# Patient Record
Sex: Male | Born: 1985 | Race: White | Hispanic: No | Marital: Married | Smoking: Never smoker
Health system: Southern US, Community
[De-identification: ages and names within clinical notes are randomized; demographics above are authoritative.]

## PROBLEM LIST (undated history)

## (undated) DIAGNOSIS — R42 Dizziness and giddiness: Secondary | ICD-10-CM

## (undated) DIAGNOSIS — I10 Essential (primary) hypertension: Secondary | ICD-10-CM

---

## 2018-05-09 DIAGNOSIS — R21 Rash and other nonspecific skin eruption: Secondary | ICD-10-CM | POA: Insufficient documentation

## 2018-06-12 DIAGNOSIS — I10 Essential (primary) hypertension: Secondary | ICD-10-CM | POA: Insufficient documentation

## 2018-06-12 DIAGNOSIS — N529 Male erectile dysfunction, unspecified: Secondary | ICD-10-CM | POA: Insufficient documentation

## 2018-06-12 DIAGNOSIS — S46911A Strain of unspecified muscle, fascia and tendon at shoulder and upper arm level, right arm, initial encounter: Secondary | ICD-10-CM | POA: Insufficient documentation

## 2018-06-12 DIAGNOSIS — R0683 Snoring: Secondary | ICD-10-CM | POA: Insufficient documentation

## 2019-12-26 DIAGNOSIS — R42 Dizziness and giddiness: Secondary | ICD-10-CM | POA: Insufficient documentation

## 2019-12-26 DIAGNOSIS — H538 Other visual disturbances: Secondary | ICD-10-CM | POA: Insufficient documentation

## 2020-01-12 ENCOUNTER — Inpatient Hospital Stay: Payer: Managed Care, Other (non HMO) | Attending: Internal Medicine | Admitting: Internal Medicine

## 2020-01-12 DIAGNOSIS — R7989 Other specified abnormal findings of blood chemistry: Secondary | ICD-10-CM | POA: Insufficient documentation

## 2020-04-08 ENCOUNTER — Emergency Department: Payer: 59

## 2020-04-08 ENCOUNTER — Emergency Department
Admission: EM | Admit: 2020-04-08 | Discharge: 2020-04-08 | Disposition: A | Discharge status: Home / Self Care | Payer: 59 | Attending: Student | Admitting: Student

## 2020-04-08 ENCOUNTER — Other Ambulatory Visit: Payer: Self-pay

## 2020-04-08 DIAGNOSIS — N200 Calculus of kidney: Secondary | ICD-10-CM | POA: Diagnosis not present

## 2020-04-08 DIAGNOSIS — Z79899 Other long term (current) drug therapy: Secondary | ICD-10-CM | POA: Insufficient documentation

## 2020-04-08 DIAGNOSIS — I10 Essential (primary) hypertension: Secondary | ICD-10-CM | POA: Diagnosis not present

## 2020-04-08 DIAGNOSIS — R161 Splenomegaly, not elsewhere classified: Secondary | ICD-10-CM

## 2020-04-08 DIAGNOSIS — R109 Unspecified abdominal pain: Secondary | ICD-10-CM | POA: Diagnosis present

## 2020-04-08 HISTORY — DX: Essential (primary) hypertension: I10

## 2020-04-08 HISTORY — DX: Dizziness and giddiness: R42

## 2020-04-08 LAB — URINALYSIS, COMPLETE (UACMP) WITH MICROSCOPIC
Glucose, UA: NEGATIVE mg/dL
Leukocytes,Ua: NEGATIVE
Nitrite: NEGATIVE
Protein, ur: 100 mg/dL — AB
RBC / HPF: >50 RBC/hpf (ref 0–5)
Specific Gravity, Urine: >1.03 — ABNORMAL HIGH (ref 1.005–1.030)
pH: 5.5 (ref 5.0–8.0)

## 2020-04-08 LAB — CBC WITH DIFFERENTIAL/PLATELET
Abs Immature Granulocytes: 0.01 10*3/uL (ref 0.00–0.07)
Basophils Absolute: 0 10*3/uL (ref 0.0–0.1)
Basophils Relative: 1 %
Eosinophils Absolute: 0.1 10*3/uL (ref 0.0–0.5)
Eosinophils Relative: 1 %
HCT: 46.4 % (ref 39.0–52.0)
Hemoglobin: 17.2 g/dL — ABNORMAL HIGH (ref 13.0–17.0)
Immature Granulocytes: 0 %
Lymphocytes Relative: 25 %
Lymphs Abs: 2.1 10*3/uL (ref 0.7–4.0)
MCH: 32.5 pg (ref 26.0–34.0)
MCHC: 37.1 g/dL — ABNORMAL HIGH (ref 30.0–36.0)
MCV: 87.7 fL (ref 80.0–100.0)
Monocytes Absolute: 0.4 10*3/uL (ref 0.1–1.0)
Monocytes Relative: 5 %
Neutro Abs: 5.6 10*3/uL (ref 1.7–7.7)
Neutrophils Relative %: 68 %
Platelets: 235 10*3/uL (ref 150–400)
RBC: 5.29 MIL/uL (ref 4.22–5.81)
RDW: 12.3 % (ref 11.5–15.5)
WBC: 8.2 10*3/uL (ref 4.0–10.5)
nRBC: 0 % (ref 0.0–0.2)

## 2020-04-08 MED ORDER — OXYCODONE-ACETAMINOPHEN 5-325 MG PO TABS: 1.0000 | ORAL_TABLET | ORAL | 0 refills | Status: AC | PRN

## 2020-04-08 MED ORDER — TAMSULOSIN HCL 0.4 MG PO CAPS: 0.4000 mg | ORAL_CAPSULE | Freq: Every day | ORAL | 0 refills | Status: AC

## 2020-04-08 MED ORDER — SODIUM CHLORIDE 0.9 % IV BOLUS
1000.0000 mL | Freq: Once | INTRAVENOUS | Status: AC
  Administered 2020-04-08: 15:00:00 1000 mL via INTRAVENOUS

## 2020-04-08 NOTE — ED Notes (Signed)
Pt given cup to attempt urine sample at this time

## 2020-04-08 NOTE — Discharge Instructions (Addendum)
Follow-up with your regular doctor for your already scheduled appointment.  Drink plenty of fluids.  Take pain medications

## 2020-04-08 NOTE — ED Provider Notes (Signed)
[facility] Emergency Department Provider Note  ____________________________________________   None    (approximate)  I have reviewed the triage vital signs and the nursing notes.   HISTORY  Chief Complaint Urinary Retention    HPI Dakota Gonzales is a 34 y.o. male presents emergency department complaining of some abdominal discomfort and difficulty urinating.  Symptoms started on Sunday.  States that he had started on bupropion and felt that urinary retention was a side effect of this medication so he stopped taking the medication.  States he got somewhat better and then the symptoms have returned.  Did have back pain up around the kidney earlier today with pain radiating into the testicle.  Also having some constipation for the last 2 3 days.  Patient also admits to not drinking a lot of water.  He denies any fever or chills.  No chest pain or shortness of breath.  No penile discharge or concerns for STD.    Past Medical History:  Diagnosis Date  . Dizziness   . Hypertension     Patient Active Problem List   Diagnosis Date Noted  . Elevated ferritin 01/12/2020      Prior to Admission medications   Medication Sig Start Date End Date Taking? Authorizing Provider  busPIRone (BUSPAR) 5 MG tablet Take 5 mg by mouth 3 (three) times daily.   Yes [provider]  hydrochlorothiazide (HYDRODIURIL) 25 MG tablet Take 25 mg by mouth daily.   Yes [provider]  oxyCODONE-acetaminophen (PERCOCET) 5-325 MG tablet Take 1 tablet by mouth every 4 (four) hours as needed for severe pain. 04/08/20 04/08/21  Fox Salminen, Roselyn Bering, PA-C  tamsulosin (FLOMAX) 0.4 MG CAPS capsule Take 1 capsule (0.4 mg total) by mouth daily. 04/08/20   Sherrie Mustache Roselyn Bering, PA-C    Allergies Ceclor [cefaclor] and Septra [sulfamethoxazole-trimethoprim]  No family history on file.  Social History Social History   Tobacco Use  . Smoking status: Not on file  Substance Use Topics    . Alcohol use: Not on file  . Drug use: Not on file    Review of Systems  Constitutional: No fever/chills Eyes: No visual changes. ENT: No sore throat. Respiratory: Denies cough Cardiovascular: Denies chest pain Gastrointestinal: Positive abdominal pain Genitourinary: Negative for dysuria.  Positive decreased urination Musculoskeletal: Negative for back pain. Skin: Negative for rash. Psychiatric: no mood changes,     ____________________________________________   PHYSICAL EXAM:  VITAL SIGNS: ED Triage Vitals  Enc Vitals Group     BP 04/08/20 1101 (!) 146/96     Pulse Rate 04/08/20 1101 71     Resp 04/08/20 1101 19     Temp 04/08/20 1101 98.3 F (36.8 C)     Temp Source 04/08/20 1101 Oral     SpO2 04/08/20 1101 96 %     Weight 04/08/20 1105 (!) 365 lb (165.6 kg)     Height 04/08/20 1105 6\' 1"  (1.854 m)     Head Circumference --      Peak Flow --      Pain Score 04/08/20 1105 8     Pain Loc --      Pain Edu? --      Excl. in GC? --     Constitutional: Alert and oriented. Well appearing and in no acute distress. Eyes: Conjunctivae are normal.  Head: Atraumatic. Nose: No congestion/rhinnorhea. Mouth/Throat: Mucous membranes are moist.   Neck:  supple no lymphadenopathy noted Cardiovascular: Normal rate, regular rhythm. Heart sounds  are normal Respiratory: Normal respiratory effort.  No retractions, lungs c t a  Abd: soft nontender bs normal all 4 quad GU: Penis without lesions, no irritation noted at the urethra, testicles minimally tender if at all. Musculoskeletal: FROM all extremities, warm and well perfused Neurologic:  Normal speech and language.  Skin:  Skin is warm, dry and intact. No rash noted. Psychiatric: Mood and affect are normal. Speech and behavior are normal.  ____________________________________________   LABS (all labs ordered are listed, but only abnormal results are displayed)  Labs Reviewed  URINALYSIS, COMPLETE (UACMP) WITH  MICROSCOPIC - Abnormal; Notable for the following components:      Result Value   APPearance CLOUDY (*)    Specific Gravity, Urine >1.030 (*)    Hgb urine dipstick LARGE (*)    Bilirubin Urine SMALL (*)    Ketones, ur TRACE (*)    Protein, ur 100 (*)    Bacteria, UA RARE (*)    All other components within normal limits  CBC WITH DIFFERENTIAL/PLATELET - Abnormal; Notable for the following components:   Hemoglobin 17.2 (*)    MCHC 37.1 (*)    All other components within normal limits   ____________________________________________   ____________________________________________  RADIOLOGY  Bladder scan shows no urine KUB does not show a large amount of stool burden CT renal stone study shows a 5 mm kidney stone and splenomegaly  ____________________________________________   PROCEDURES  Procedure(s) performed: No  Procedures    ____________________________________________   INITIAL IMPRESSION / ASSESSMENT AND PLAN / ED COURSE  Pertinent labs & imaging results that were available during my care of the patient were reviewed by me and considered in my medical decision making (see chart for details).   Patient is a 34 year old male presents emergency department with lower abdominal pain, flank pain, and difficulty with urination.  Physical exam shows patient to appear well.  Vitals basically normal except blood pressure mildly elevated at 146/96.  Abdomen mildly tender along the suprapubic area, testicles minimally tender remainder the exam is unremarkable  DDx: Urinary retention, kidney stone, epididymitis, dehydration, UTI  UA KUB is normal CT renal stone study  UA has large amount of hemoglobin, greater than 50 RBCs, CBC is normal  KUB does not show a heavy stool burden, CT renal stone does show a 5 mm kidney stone.  Also shows splenomegaly . Encourage the patient to follow-up with his doctor for his already scheduled appointment tomorrow.  Given copy of CT  results so that he can discuss the splenomegaly with her.  He was given a liter of normal saline to help push the kidney stone through, Flomax did not aid him and urinating, pain medication if needed due to the kidney stone.  He states he understands will comply with our instructions.  He was discharged stable condition.   Rohith Fauth was evaluated in Emergency Department on 04/08/2020 for the symptoms described in the history of present illness. He was evaluated in the context of the global COVID-19 pandemic, which necessitated consideration that the patient might be at risk for infection with the SARS-CoV-2 virus that causes COVID-19. Institutional protocols and algorithms that pertain to the evaluation of patients at risk for COVID-19 are in a state of rapid change based on information released by regulatory bodies including the CDC and federal and state organizations. These policies and algorithms were followed during the patient's care in the ED.   As part of my medical decision making, I reviewed the following data  within the [place] notes reviewed and incorporated, Labs reviewed , Old chart reviewed, Radiograph reviewed , Notes from prior ED visits and [place] Controlled Substance Database  ____________________________________________   FINAL CLINICAL IMPRESSION(S) / ED DIAGNOSES  Final diagnoses:  Kidney stone  Splenomegaly      NEW MEDICATIONS STARTED DURING THIS VISIT:  Discharge Medication List as of 04/08/2020  3:24 PM    START taking these medications   Details  oxyCODONE-acetaminophen (PERCOCET) 5-325 MG tablet Take 1 tablet by mouth every 4 (four) hours as needed for severe pain., Starting Thu 04/08/2020, Until Fri 04/08/2021, Normal    tamsulosin (FLOMAX) 0.4 MG CAPS capsule Take 1 capsule (0.4 mg total) by mouth daily., Starting Thu 04/08/2020, Normal         Note:  This document was prepared using Dragon voice recognition software and may include  unintentional dictation errors.    Versie Starks, PA-C 04/08/20 1744    Lilia Pro., MD 04/09/20 [phone]

## 2020-04-08 NOTE — ED Notes (Signed)
Bladder scanned 0 mL. Darl Pikes PA notified

## 2020-04-08 NOTE — ED Triage Notes (Signed)
Pt sent from drs. office for urinary retention since Sunday. Pain with urination in back and groin. Pt also c/o constipation for past 2-3 days.

## 2020-04-08 NOTE — ED Notes (Signed)
See triage note  States he has had some abd discomfort and diff urinating  Sx's started on Sunday  States has only voided small amts at a time  No hx of same  Ambulates well  No fever

## 2020-12-09 IMAGING — CT CT RENAL STONE PROTOCOL
2 of 4 series · 16 of 46 positions shown, 18 images · non-contrast
Comparison: None.

CLINICAL DATA: Flank pain. Kidney stone suspected.

EXAM:
CT ABDOMEN AND PELVIS WITHOUT CONTRAST
TECHNIQUE: Multidetector CT imaging of the abdomen and pelvis was performed
following the standard protocol without IV contrast.

[Series 2: stone full standard · axial · 0.92mm/px · z∈[-901,-326]mm · 13 of 127 slices shown, 15 images]
[im 6/127  soft-tissue]
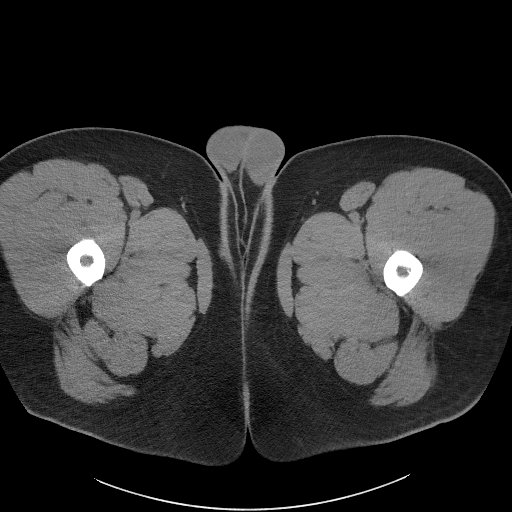
[im 6/127  bone]
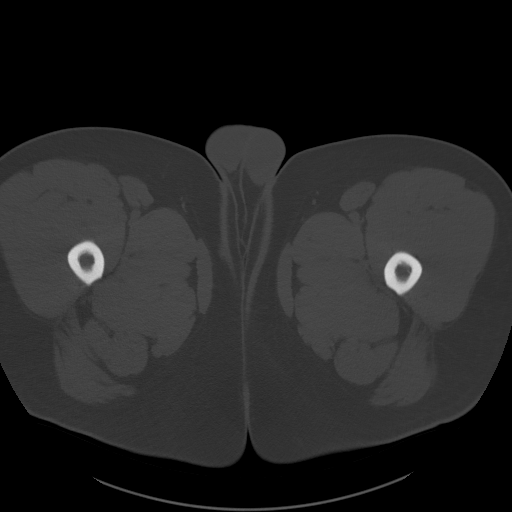
[im 17/127  soft-tissue]
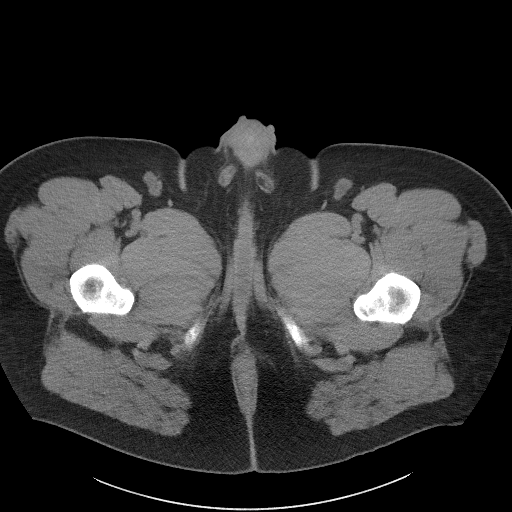
[im 28/127  soft-tissue]
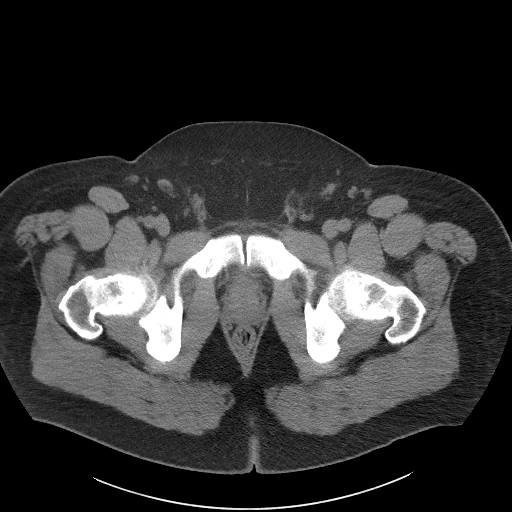
[im 33/127  soft-tissue]
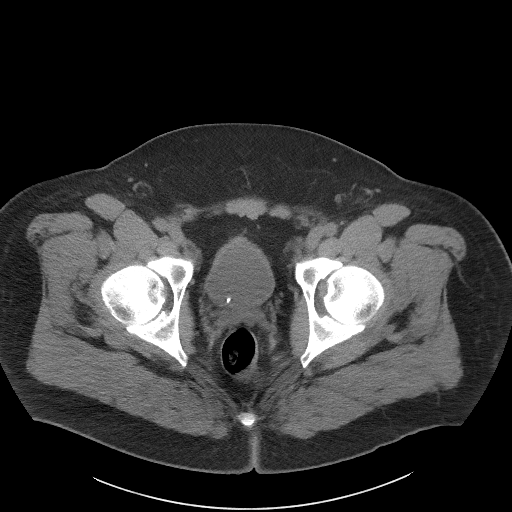
[im 44/127  soft-tissue]
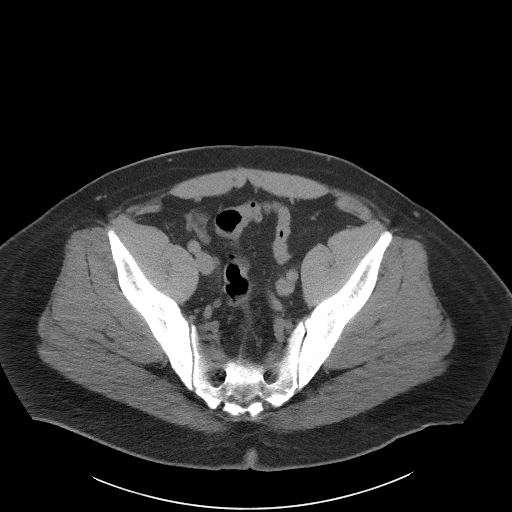
[im 55/127  soft-tissue]
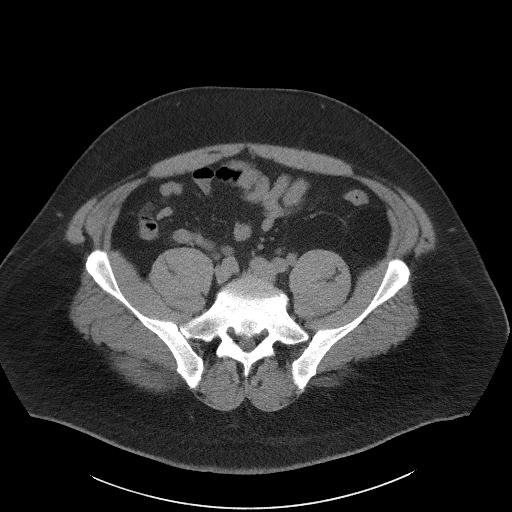
[im 66/127  soft-tissue]
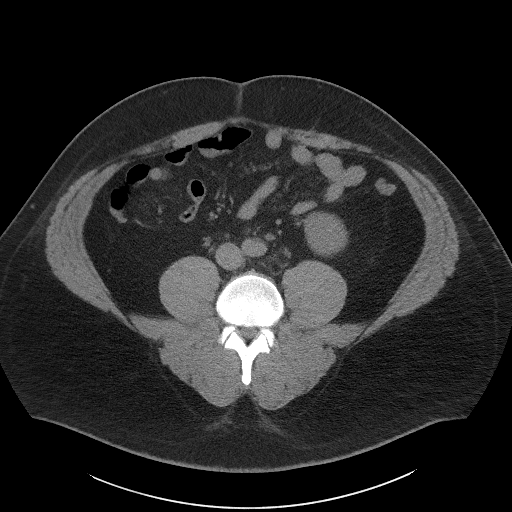
[im 72/127  soft-tissue]
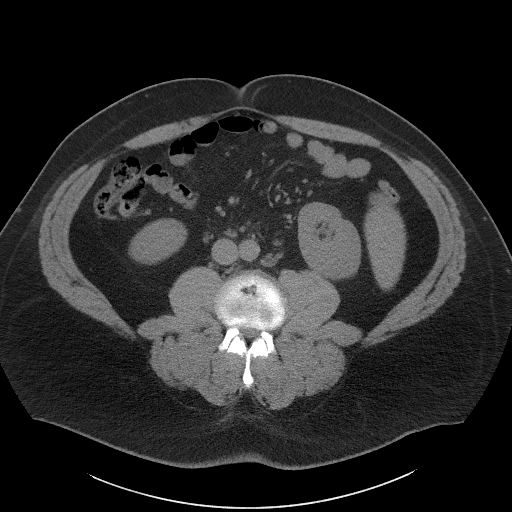
[im 83/127  soft-tissue]
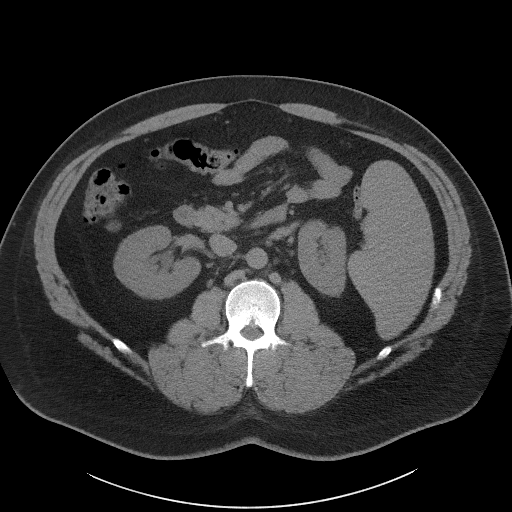
[im 83/127  bone]
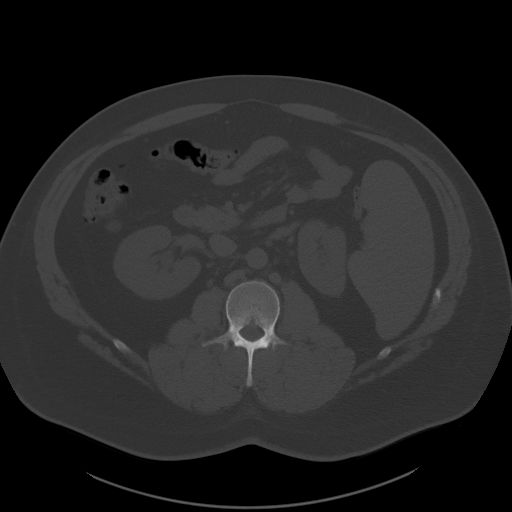
[im 94/127  soft-tissue]
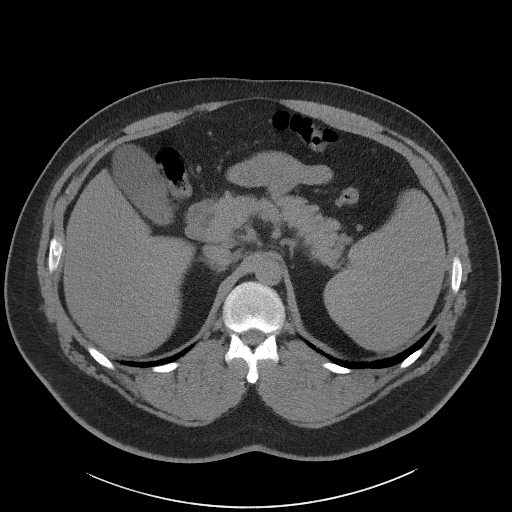
[im 99/127  soft-tissue]
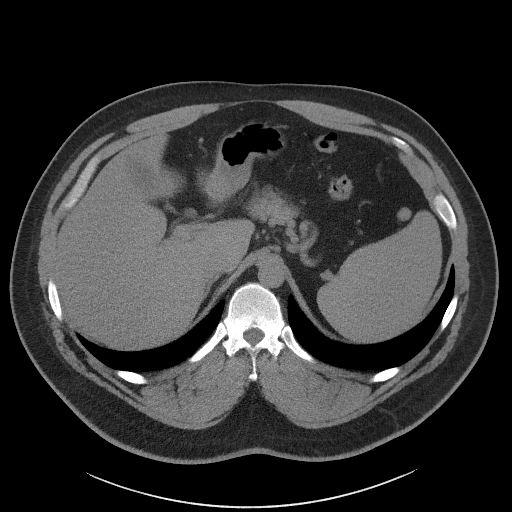
[im 110/127  soft-tissue]
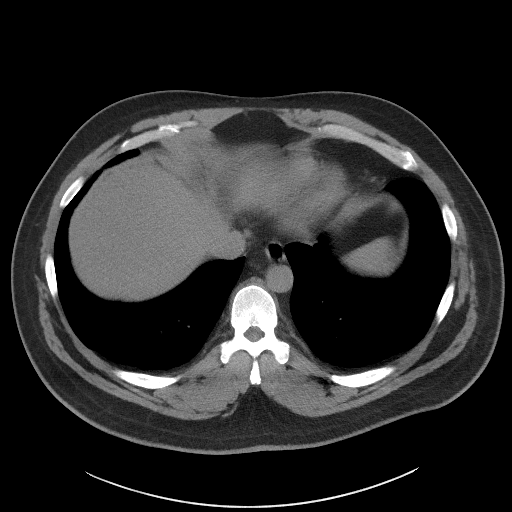
[im 121/127  soft-tissue]
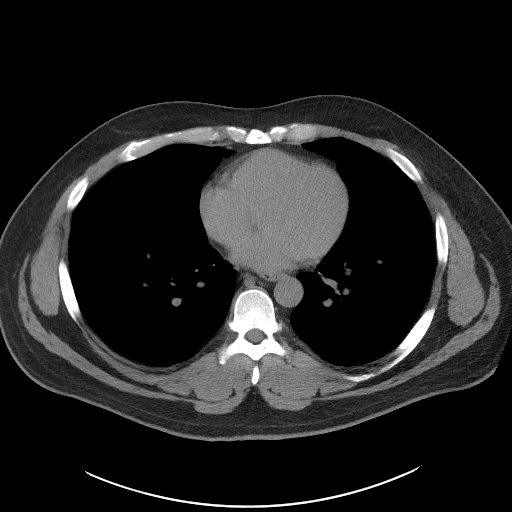

[Series 5: coronal · coronal · 1.03mm/px · 3 of 186 slices shown]
[im 62/186  soft-tissue]
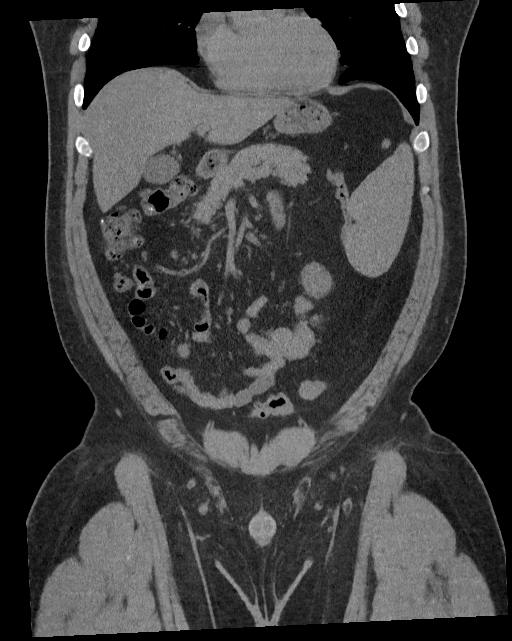
[im 83/186  soft-tissue]
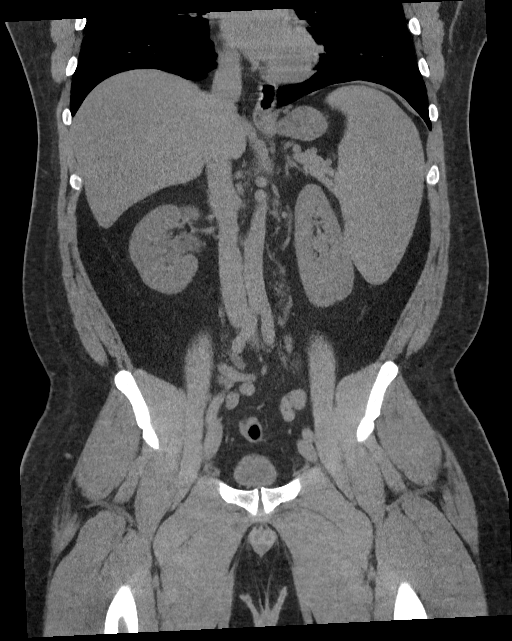
[im 103/186  soft-tissue]
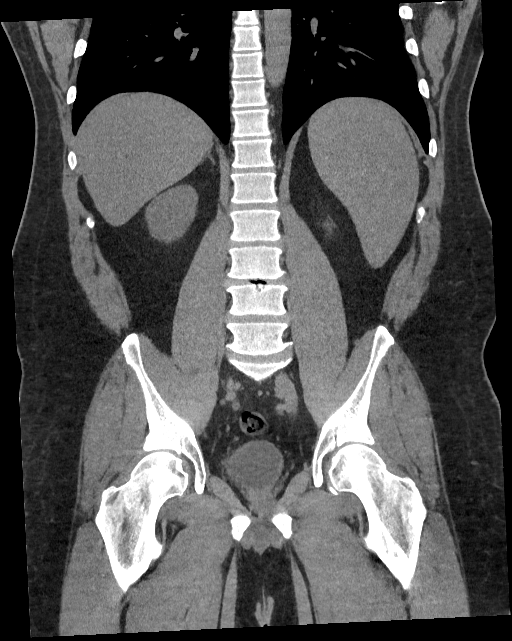

[16 of 46 positions shown; findings below may reference images not displayed]

FINDINGS: Lower chest: No acute abnormality.

Hepatobiliary: No focal liver abnormality is seen. No gallstones,
gallbladder wall thickening, or biliary dilatation.

Pancreas: Unremarkable. No pancreatic ductal dilatation or
surrounding inflammatory changes.

Spleen: The spleen measures 13.4 x 9.3 by 20.2 (volume = 5177).

Adrenals/Urinary Tract: Normal appearance of the adrenal glands. The
right-sided pelvocaliectasis is identified along with right
hydroureter there is a stone within the urinary bladder near the
right UVJ measuring 5 mm, image 95/2. Left kidney is unremarkable.

Stomach/Bowel: Stomach is within normal limits. Appendix appears
normal. No evidence of bowel wall thickening, distention, or
inflammatory changes.

Vascular/Lymphatic: Normal appearance of the abdominal aorta. No
aneurysm. No abdominal adenopathy. No pelvic or inguinal adenopathy.

Reproductive: Prostate is unremarkable.

Other: No ascites or focal fluid collections.

Musculoskeletal: No acute or significant osseous findings. There is
a round well-defined sclerotic lesion within the proximal left femur
measuring 2.1 cm. This has an internal chondroid matrix and likely
represents a benign bone island. Degenerative disc disease
identified at L3-4, L4-5 and L5-S1. No acute or suspicious bone
lesions.
IMPRESSION: 1. Right-sided pelvocaliectasis and hydroureter secondary to 5 mm
stone within the urinary bladder near the right UVJ.
2. Marked splenomegaly. The spleen measures 20 cm in length and has
a volume of 5177 cc. Etiology indeterminate.

## 2021-04-14 DIAGNOSIS — Z6841 Body Mass Index (BMI) 40.0 and over, adult: Secondary | ICD-10-CM | POA: Insufficient documentation

## 2022-04-30 ENCOUNTER — Ambulatory Visit: Payer: Self-pay

## 2022-04-30 ENCOUNTER — Ambulatory Visit (INDEPENDENT_AMBULATORY_CARE_PROVIDER_SITE_OTHER): Payer: BC Managed Care – PPO

## 2022-04-30 ENCOUNTER — Ambulatory Visit
Admission: EM | Admit: 2022-04-30 | Discharge: 2022-04-30 | Disposition: A | Discharge status: Home / Self Care | Payer: BC Managed Care – PPO | Attending: Emergency Medicine | Admitting: Emergency Medicine

## 2022-04-30 DIAGNOSIS — R0602 Shortness of breath: Secondary | ICD-10-CM

## 2022-04-30 DIAGNOSIS — R051 Acute cough: Secondary | ICD-10-CM | POA: Diagnosis not present

## 2022-04-30 DIAGNOSIS — I10 Essential (primary) hypertension: Secondary | ICD-10-CM

## 2022-04-30 DIAGNOSIS — J209 Acute bronchitis, unspecified: Secondary | ICD-10-CM

## 2022-04-30 MED ORDER — BENZONATATE 100 MG PO CAPS: 100.0000 mg | ORAL_CAPSULE | Freq: Three times a day (TID) | ORAL | 0 refills | Status: AC | PRN

## 2022-04-30 MED ORDER — PREDNISONE 10 MG (21) PO TBPK: ORAL_TABLET | Freq: Every day | ORAL | 0 refills | Status: AC

## 2022-04-30 MED ORDER — ALBUTEROL SULFATE HFA 108 (90 BASE) MCG/ACT IN AERS: 1.0000 | INHALATION_SPRAY | Freq: Four times a day (QID) | RESPIRATORY_TRACT | 0 refills | Status: AC | PRN

## 2022-04-30 MED ORDER — AZITHROMYCIN 250 MG PO TABS: 250.0000 mg | ORAL_TABLET | Freq: Every day | ORAL | 0 refills | Status: AC

## 2022-04-30 NOTE — ED Provider Notes (Signed)
Renaldo Fiddler    CSN: [id number] Arrival date & time: 04/30/22  1751      History   Chief Complaint Chief Complaint  Patient presents with   Cough   Nasal Congestion    HPI Dakota Gonzales is a 36 y.o. male.  Patient presents with cough x 1 month.  His cough is worse x 4 days and he has shortness of breath at times, worse at night.  He also has nasal congestion.  Treatment attempted with Alka seltzer cold and Robitussin DM.  His medical history includes hypertension.    The history is provided by the patient and medical records.   Past Medical History:  Diagnosis Date   Dizziness    Hypertension     Patient Active Problem List   Diagnosis Date Noted   BMI 45.0-49.9, adult (HCC) 04/14/2021   Elevated ferritin 01/12/2020   Dizziness 12/26/2019   Blurred vision, right eye 12/26/2019   Snoring 06/12/2018   Right shoulder strain, initial encounter 06/12/2018   Essential hypertension 06/12/2018   Erectile dysfunction 06/12/2018   Penile rash 05/09/2018   Class 3 severe obesity with body mass index (BMI) of 50.0 to 59.9 in adult [facility]) 05/09/2018    History reviewed. No pertinent surgical history.     Home Medications    Prior to Admission medications   Medication Sig Start Date End Date Taking? Authorizing Provider  albuterol (VENTOLIN HFA) 108 (90 Base) MCG/ACT inhaler Inhale 1-2 puffs into the lungs every 6 (six) hours as needed for wheezing or shortness of breath. 04/30/22  Yes Mickie Bail, NP  azithromycin (ZITHROMAX) 250 MG tablet Take 1 tablet (250 mg total) by mouth daily. Take first 2 tablets together, then 1 every day until finished. 04/30/22  Yes Mickie Bail, NP  benzonatate (TESSALON) 100 MG capsule Take 1 capsule (100 mg total) by mouth 3 (three) times daily as needed for cough. 04/30/22  Yes Mickie Bail, NP  predniSONE (STERAPRED UNI-PAK 21 TAB) 10 MG (21) TBPK tablet Take by mouth daily. As directed 04/30/22  Yes Mickie Bail, NP  busPIRone  (BUSPAR) 5 MG tablet Take 5 mg by mouth 3 (three) times daily.    [provider]  diclofenac (VOLTAREN) 75 MG EC tablet Take 75 mg by mouth 2 (two) times daily. 03/21/22   [provider]  FLUoxetine (PROZAC) 10 MG capsule Take 10 mg by mouth daily. 03/16/22   [provider]  hydrochlorothiazide (HYDRODIURIL) 25 MG tablet Take 25 mg by mouth daily.    [provider]  ipratropium (ATROVENT) 0.03 % nasal spray Place 2 sprays into both nostrils 2 (two) times daily. 03/21/22   [provider]  meloxicam (MOBIC) 15 MG tablet Take by mouth. 06/12/18   [provider]  Omega-3 Fatty Acids (FISH OIL) 1000 MG CAPS Take 1 capsule by mouth daily.    [provider]  pantoprazole (PROTONIX) 40 MG tablet Take 40 mg by mouth 2 (two) times daily. 04/13/22   [provider]  sildenafil (VIAGRA) 50 MG tablet Take by mouth. 06/12/18   [provider]  tamsulosin (FLOMAX) 0.4 MG CAPS capsule Take 1 capsule (0.4 mg total) by mouth daily. 04/08/20   Sherrie Mustache Roselyn Bering, PA-C    Family History History reviewed. No pertinent family history.  Social History Social History   Tobacco Use   Smoking status: Never   Smokeless tobacco: Never  Vaping Use   Vaping Use: Never used  Substance  Use Topics   Alcohol use: Never   Drug use: Never     Allergies   Cefaclor and Sulfamethoxazole-trimethoprim   Review of Systems Review of Systems  Constitutional:  Negative for chills and fever.  HENT:  Positive for congestion. Negative for ear pain and sore throat.   Respiratory:  Positive for cough and shortness of breath.   Cardiovascular:  Negative for chest pain and palpitations.  Gastrointestinal:  Negative for diarrhea and vomiting.  Skin:  Negative for color change and rash.  All other systems reviewed and are negative.   Physical Exam Triage Vital Signs ED Triage Vitals  Enc Vitals Group     BP      Pulse      Resp      Temp       Temp src      SpO2      Weight      Height      Head Circumference      Peak Flow      Pain Score      Pain Loc      Pain Edu?      Excl. in GC?    No data found.  Updated Vital Signs BP (!) 133/101   Pulse 94   Temp 98.6 F (37 C)   Resp 18   SpO2 96%   Visual Acuity Right Eye Distance:   Left Eye Distance:   Bilateral Distance:    Right Eye Near:   Left Eye Near:    Bilateral Near:     Physical Exam Vitals and nursing note reviewed.  Constitutional:      General: He is not in acute distress.    Appearance: He is well-developed. He is obese. He is not ill-appearing.  HENT:     Right Ear: Tympanic membrane normal.     Left Ear: Tympanic membrane normal.     Nose: Nose normal.     Mouth/Throat:     Mouth: Mucous membranes are moist.     Pharynx: Oropharynx is clear.  Cardiovascular:     Rate and Rhythm: Normal rate and regular rhythm.     Heart sounds: Normal heart sounds.  Pulmonary:     Effort: Pulmonary effort is normal. No respiratory distress.     Breath sounds: Normal breath sounds.  Musculoskeletal:     Cervical back: Neck supple.  Skin:    General: Skin is warm and dry.  Neurological:     Mental Status: He is alert.  Psychiatric:        Mood and Affect: Mood normal.        Behavior: Behavior normal.     UC Treatments / Results  Labs (all labs ordered are listed, but only abnormal results are displayed) Labs Reviewed - No data to display  EKG   Radiology DG Chest 2 View  Result Date: 04/30/2022 CLINICAL DATA:  Cough, shortness of breath EXAM: CHEST - 2 VIEW COMPARISON:  None Available. FINDINGS: Cardiac size is within normal limits. Lung fields are clear of any infiltrates or pulmonary edema. Left lateral CP angle is not included. There is no pleural effusion or pneumothorax. Deformity in the left first rib may be a congenital variation with an accessory rib. IMPRESSION: No active cardiopulmonary disease. Electronically Signed   By: Ernie Avena M.D.   On: 04/30/2022 09:15    Procedures Procedures (including critical care time)  Medications Ordered in UC Medications - No data to display  Initial Impression / Assessment and Plan / UC Course  I have reviewed the triage vital signs and the nursing notes.  Pertinent labs & imaging results that were available during my care of the patient were reviewed by me and considered in my medical decision making (see chart for details).    Shortness of breath, cough, acute bronchitis.  Elevated blood pressure reading with hypertension.  Chest x-ray negative.  Treating with Tessalon Perles, prednisone, Zithromax, albuterol inhaler.  Instructed patient to follow-up with his PCP this week.  ED precautions provided.  Also discussed with patient that his blood pressure is elevated and needs to be rechecked by his PCP in 1 to 2 weeks.  Education provided on managing hypertension.  Patient agrees to plan of care.  Final Clinical Impressions(s) / UC Diagnoses   Final diagnoses:  Shortness of breath  Acute cough  Acute bronchitis, unspecified organism  Elevated blood pressure reading in office with diagnosis of hypertension     Discharge Instructions      Use the albuterol inhaler as directed.  Take the prednisone, Zithromax, and Tessalon Perles as directed.  Follow up with your primary care provider.    Your blood pressure is elevated today at 159/98; recheck 133/101.  Please have this rechecked by your primary care provider in 1-2 weeks.          ED Prescriptions     Medication Sig Dispense Auth. Provider   benzonatate (TESSALON) 100 MG capsule Take 1 capsule (100 mg total) by mouth 3 (three) times daily as needed for cough. 21 capsule Mickie Bailate, Dorenda Pfannenstiel H, NP   albuterol (VENTOLIN HFA) 108 (90 Base) MCG/ACT inhaler Inhale 1-2 puffs into the lungs every 6 (six) hours as needed for wheezing or shortness of breath. 18 g Mickie Bailate, Jamarquis Crull H, NP   predniSONE (STERAPRED UNI-PAK 21 TAB) 10 MG  (21) TBPK tablet Take by mouth daily. As directed 21 tablet Mickie Bailate, Randall Rampersad H, NP   azithromycin (ZITHROMAX) 250 MG tablet Take 1 tablet (250 mg total) by mouth daily. Take first 2 tablets together, then 1 every day until finished. 6 tablet Mickie Bailate, Marice Guidone H, NP      PDMP not reviewed this encounter.   Mickie Bailate, Shayden Bobier H, NP 04/30/22 0930

## 2022-04-30 NOTE — ED Triage Notes (Signed)
Patient presents to Urgent Care with complaints of cough and nasal congestion x 4 days. Treated for a URI a month ago, cough worsened 4 days ago. States cough never resolved. Treating symptoms with Robitussin DM.  Denies fever.

## 2022-04-30 NOTE — Discharge Instructions (Addendum)
Use the albuterol inhaler as directed.  Take the prednisone, Zithromax, and Tessalon Perles as directed.  Follow up with your primary care provider.    Your blood pressure is elevated today at 159/98; recheck 133/101.  Please have this rechecked by your primary care provider in 1-2 weeks.

## 2022-12-31 IMAGING — DX DG CHEST 2V
3 series · 3 of 3 positions shown · non-contrast
Comparison: None Available.

CLINICAL DATA: Cough, shortness of breath

EXAM:
CHEST - 2 VIEW

[chest pa (1 of 2)]
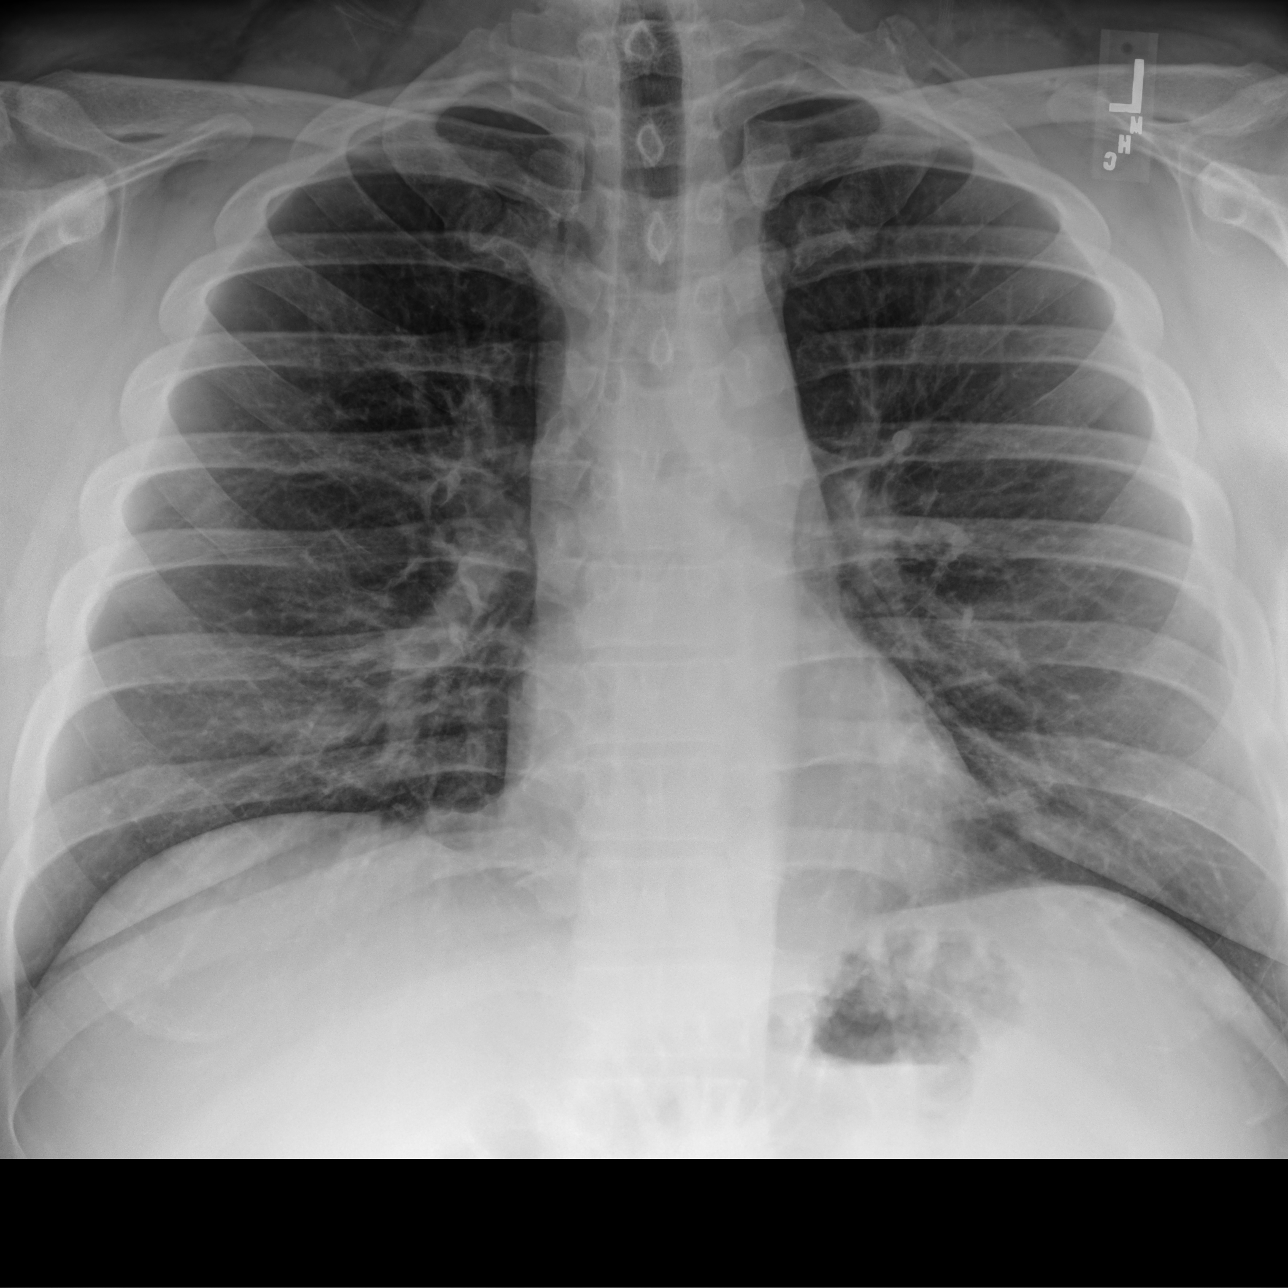

[chest pa (2 of 2)]
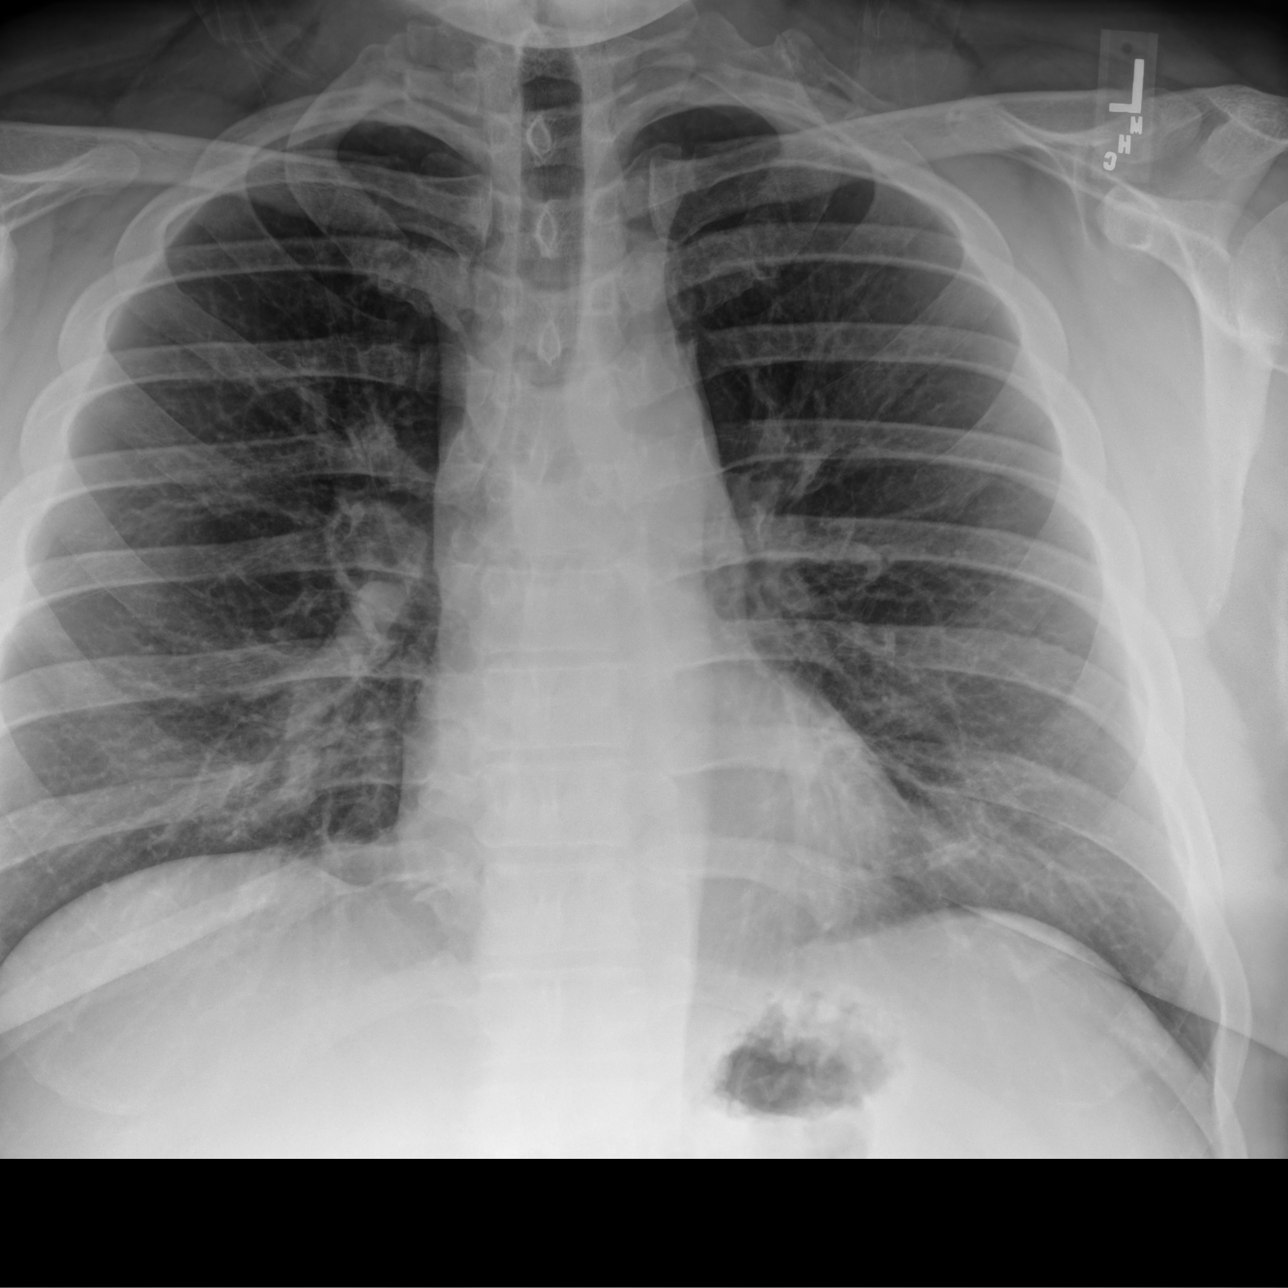

[chest lat]
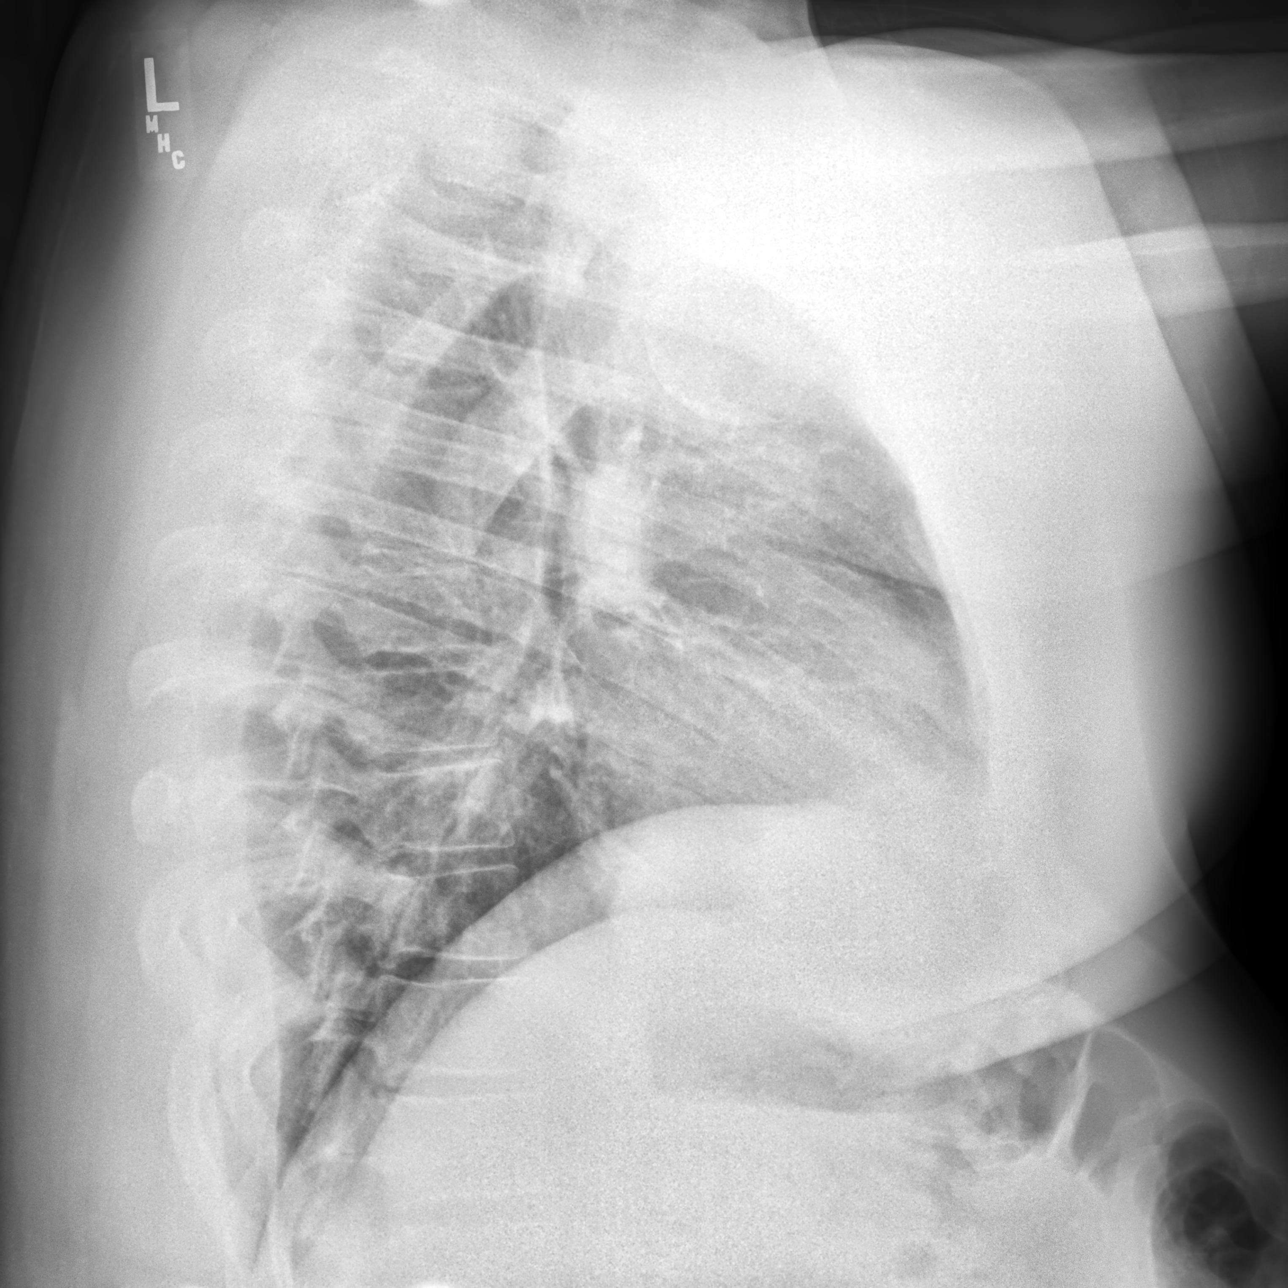

[3 of 3 positions shown; findings below may reference images not displayed]

FINDINGS: Cardiac size is within normal limits. Lung fields are clear of any
infiltrates or pulmonary edema. Left lateral CP angle is not
included. There is no pleural effusion or pneumothorax. Deformity in
the left first rib may be a congenital variation with an accessory
rib.
IMPRESSION: No active cardiopulmonary disease.
